# Patient Record
Sex: Male | Born: 1955 | Race: Black or African American | Hispanic: No | Marital: Married
Health system: Southern US, Community
[De-identification: ages and names within clinical notes are randomized; demographics above are authoritative.]

---

## 2003-07-28 ENCOUNTER — Ambulatory Visit (HOSPITAL_COMMUNITY): Admission: RE | Admit: 2003-07-28 | Discharge: 2003-07-28 | Payer: Self-pay | Admitting: *Deleted

## 2004-06-30 ENCOUNTER — Emergency Department (HOSPITAL_COMMUNITY): Admission: EM | Admit: 2004-06-30 | Discharge: 2004-06-30 | Payer: Self-pay | Admitting: Emergency Medicine
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
## 2019-04-07 ENCOUNTER — Ambulatory Visit: Payer: Medicare PPO | Attending: Internal Medicine

## 2019-04-07 DIAGNOSIS — Z23 Encounter for immunization: Secondary | ICD-10-CM

## 2019-04-07 NOTE — Progress Notes (Signed)
   Cov[facility] Vaccination [facility]  Name:  Lawrnce Reyez    MRN: [id number] DOB: [birth year]  04/07/2019  [name] was observed post Covid-19 immunization for 15 minutes without incident. He was provided with Vaccine Information Sheet and instruction to access the V-Safe system.   [name] was instructed to call 911 with any severe reactions post vaccine: Marland Kitchen Difficulty breathing  . Swelling of face and throat  . A fast heartbeat  . A bad rash all over body  . Dizziness and weakness   Immunizations Administered    Name Date Dose VIS Date Route   Pfizer COVID-19 Vaccine 04/07/2019  2:17 PM 0.3 mL 12/17/2018 Intramuscular   Manufacturer: [employer], [employer]   Lot: [id number]   NDC: [address]-4    pt reported no issues

## 2019-04-11 ENCOUNTER — Ambulatory Visit: Payer: Self-pay

## 2019-05-02 ENCOUNTER — Ambulatory Visit: Payer: Medicare PPO | Attending: Internal Medicine

## 2019-05-02 DIAGNOSIS — Z23 Encounter for immunization: Secondary | ICD-10-CM

## 2019-05-02 NOTE — Progress Notes (Signed)
   Covid-19 Vaccination Clinic  Name:  Dailyn Kempner    MRN: [id number] DOB: 1955/11/26  05/02/2019  Mr. Hestand was observed post Covid-19 immunization for 15 minutes without incident. He was provided with Vaccine Information Sheet and instruction to access the V-Safe system.   Mr. Rondinelli was instructed to call 911 with any severe reactions post vaccine: Marland Kitchen Difficulty breathing  . Swelling of face and throat  . A fast heartbeat  . A bad rash all over body  . Dizziness and weakness   Immunizations Administered    Name Date Dose VIS Date Route   Pfizer COVID-19 Vaccine 05/02/2019 12:39 PM 0.3 mL 03/02/2018 Intramuscular   Manufacturer: [employer], [employer]   Lot: [id number]   NDC: [address]-6

## 2019-11-08 DIAGNOSIS — Z0000 Encounter for general adult medical examination without abnormal findings: Secondary | ICD-10-CM | POA: Diagnosis not present

## 2019-11-08 DIAGNOSIS — R001 Bradycardia, unspecified: Secondary | ICD-10-CM | POA: Diagnosis not present

## 2019-11-08 DIAGNOSIS — E7800 Pure hypercholesterolemia, unspecified: Secondary | ICD-10-CM | POA: Diagnosis not present

## 2019-11-08 DIAGNOSIS — Z1389 Encounter for screening for other disorder: Secondary | ICD-10-CM | POA: Diagnosis not present

## 2019-11-08 DIAGNOSIS — Z1211 Encounter for screening for malignant neoplasm of colon: Secondary | ICD-10-CM | POA: Diagnosis not present

## 2019-11-08 DIAGNOSIS — Z1159 Encounter for screening for other viral diseases: Secondary | ICD-10-CM | POA: Diagnosis not present

## 2019-11-08 DIAGNOSIS — Z136 Encounter for screening for cardiovascular disorders: Secondary | ICD-10-CM | POA: Diagnosis not present

## 2019-11-08 DIAGNOSIS — Z125 Encounter for screening for malignant neoplasm of prostate: Secondary | ICD-10-CM | POA: Diagnosis not present

## 2019-11-10 DIAGNOSIS — Z1211 Encounter for screening for malignant neoplasm of colon: Secondary | ICD-10-CM | POA: Diagnosis not present

## 2021-04-05 DIAGNOSIS — E7800 Pure hypercholesterolemia, unspecified: Secondary | ICD-10-CM | POA: Diagnosis not present

## 2021-04-05 DIAGNOSIS — Z0000 Encounter for general adult medical examination without abnormal findings: Secondary | ICD-10-CM | POA: Diagnosis not present

## 2021-04-05 DIAGNOSIS — Z136 Encounter for screening for cardiovascular disorders: Secondary | ICD-10-CM | POA: Diagnosis not present

## 2021-04-05 DIAGNOSIS — Z1389 Encounter for screening for other disorder: Secondary | ICD-10-CM | POA: Diagnosis not present

## 2021-04-05 DIAGNOSIS — Z23 Encounter for immunization: Secondary | ICD-10-CM | POA: Diagnosis not present

## 2021-04-05 DIAGNOSIS — Z1211 Encounter for screening for malignant neoplasm of colon: Secondary | ICD-10-CM | POA: Diagnosis not present

## 2021-04-05 DIAGNOSIS — Z125 Encounter for screening for malignant neoplasm of prostate: Secondary | ICD-10-CM | POA: Diagnosis not present

## 2021-04-16 DIAGNOSIS — Z1211 Encounter for screening for malignant neoplasm of colon: Secondary | ICD-10-CM | POA: Diagnosis not present

## 2021-05-01 DIAGNOSIS — M542 Cervicalgia: Secondary | ICD-10-CM | POA: Diagnosis not present

## 2022-05-27 DIAGNOSIS — Z0000 Encounter for general adult medical examination without abnormal findings: Secondary | ICD-10-CM | POA: Diagnosis not present

## 2022-05-27 DIAGNOSIS — Z1211 Encounter for screening for malignant neoplasm of colon: Secondary | ICD-10-CM | POA: Diagnosis not present

## 2022-05-27 DIAGNOSIS — Z1331 Encounter for screening for depression: Secondary | ICD-10-CM | POA: Diagnosis not present

## 2022-05-27 DIAGNOSIS — Z125 Encounter for screening for malignant neoplasm of prostate: Secondary | ICD-10-CM | POA: Diagnosis not present

## 2022-05-27 DIAGNOSIS — E7800 Pure hypercholesterolemia, unspecified: Secondary | ICD-10-CM | POA: Diagnosis not present

## 2022-05-28 DIAGNOSIS — Z1211 Encounter for screening for malignant neoplasm of colon: Secondary | ICD-10-CM | POA: Diagnosis not present

## 2023-07-13 DIAGNOSIS — E7800 Pure hypercholesterolemia, unspecified: Secondary | ICD-10-CM | POA: Diagnosis not present

## 2023-07-13 DIAGNOSIS — Z1211 Encounter for screening for malignant neoplasm of colon: Secondary | ICD-10-CM | POA: Diagnosis not present

## 2023-07-13 DIAGNOSIS — Z125 Encounter for screening for malignant neoplasm of prostate: Secondary | ICD-10-CM | POA: Diagnosis not present

## 2023-07-13 DIAGNOSIS — Z1331 Encounter for screening for depression: Secondary | ICD-10-CM | POA: Diagnosis not present

## 2023-07-13 DIAGNOSIS — Z0000 Encounter for general adult medical examination without abnormal findings: Secondary | ICD-10-CM | POA: Diagnosis not present

## 2023-07-17 DIAGNOSIS — Z1211 Encounter for screening for malignant neoplasm of colon: Secondary | ICD-10-CM | POA: Diagnosis not present
